# Patient Record
Sex: Female | Born: 1975 | Race: White | Hispanic: No | Marital: Married
Health system: Southern US, Community
[De-identification: ages and names within clinical notes are randomized; demographics above are authoritative.]

## PROBLEM LIST (undated history)

## (undated) HISTORY — PX: BREAST BIOPSY: SHX20

---

## 2013-06-11 ENCOUNTER — Other Ambulatory Visit: Payer: Self-pay

## 2013-06-11 DIAGNOSIS — Z1231 Encounter for screening mammogram for malignant neoplasm of breast: Secondary | ICD-10-CM

## 2013-06-25 ENCOUNTER — Ambulatory Visit
Admission: RE | Admit: 2013-06-25 | Discharge: 2013-06-25 | Disposition: A | Discharge status: Home / Self Care | Payer: 59 | Source: Ambulatory Visit

## 2013-06-25 ENCOUNTER — Encounter (INDEPENDENT_AMBULATORY_CARE_PROVIDER_SITE_OTHER): Payer: Self-pay

## 2013-06-25 DIAGNOSIS — Z1231 Encounter for screening mammogram for malignant neoplasm of breast: Secondary | ICD-10-CM

## 2015-12-05 DIAGNOSIS — Z0000 Encounter for general adult medical examination without abnormal findings: Secondary | ICD-10-CM | POA: Diagnosis not present

## 2015-12-05 DIAGNOSIS — N946 Dysmenorrhea, unspecified: Secondary | ICD-10-CM | POA: Diagnosis not present

## 2015-12-05 DIAGNOSIS — J309 Allergic rhinitis, unspecified: Secondary | ICD-10-CM | POA: Diagnosis not present

## 2015-12-05 DIAGNOSIS — F411 Generalized anxiety disorder: Secondary | ICD-10-CM | POA: Diagnosis not present

## 2015-12-05 MED FILL — SERTRALINE HCL 50 MG TABLET: 50 | 30 days supply | Qty: 30 | Fill #0

## 2015-12-05 MED FILL — LARIN FE 1.5-30 TABLET: 1.5-30 | 84 days supply | Qty: 84 | Fill #0

## 2016-01-03 DIAGNOSIS — E039 Hypothyroidism, unspecified: Secondary | ICD-10-CM | POA: Diagnosis not present

## 2016-01-03 DIAGNOSIS — F411 Generalized anxiety disorder: Secondary | ICD-10-CM | POA: Diagnosis not present

## 2016-01-03 MED FILL — SERTRALINE HCL 100 MG TAB: 100 | 30 days supply | Qty: 30 | Fill #0

## 2016-02-05 MED FILL — SERTRALINE HCL 100 MG TAB: 100 | 30 days supply | Qty: 30 | Fill #1

## 2016-02-06 DIAGNOSIS — E039 Hypothyroidism, unspecified: Secondary | ICD-10-CM | POA: Diagnosis not present

## 2016-03-04 MED FILL — SERTRALINE HCL 100 MG TAB: 100 | 30 days supply | Qty: 30 | Fill #2

## 2016-03-04 MED FILL — LARIN FE 1.5-30 TABLET: 1.5-30 | 84 days supply | Qty: 84 | Fill #1

## 2016-04-03 MED FILL — SERTRALINE HCL 100 MG TAB: 100 | 30 days supply | Qty: 30 | Fill #3

## 2016-05-06 MED FILL — SERTRALINE HCL 100 MG TAB: 100 | 30 days supply | Qty: 30 | Fill #4

## 2016-05-22 MED FILL — LARIN FE 1.5-30 TABLET: 1.5-30 | 84 days supply | Qty: 84 | Fill #2

## 2016-06-04 MED FILL — SERTRALINE HCL 100 MG TAB: 100 | 30 days supply | Qty: 30 | Fill #5

## 2016-07-08 MED FILL — SERTRALINE HCL 100 MG TAB: 100 | 30 days supply | Qty: 30 | Fill #6

## 2016-08-07 MED FILL — SERTRALINE HCL 100 MG TAB: 100 | 30 days supply | Qty: 30 | Fill #7

## 2016-08-07 MED FILL — LARIN FE 1.5-30 TABLET: 1.5-30 | 84 days supply | Qty: 84 | Fill #3

## 2016-09-03 MED FILL — SERTRALINE HCL 100 MG TAB: 100 | 30 days supply | Qty: 30 | Fill #8

## 2016-10-03 MED FILL — SERTRALINE HCL 100 MG TAB: 100 | 30 days supply | Qty: 30 | Fill #9

## 2016-11-04 MED FILL — SERTRALINE HCL 100 MG TAB: 100 | 30 days supply | Qty: 30 | Fill #10

## 2016-11-04 MED FILL — LARIN FE 1.5-30 TABLET: 1.5-30 | 28 days supply | Qty: 28 | Fill #4

## 2016-12-04 MED FILL — SERTRALINE HCL 100 MG TAB: 100 | 30 days supply | Qty: 30 | Fill #11

## 2016-12-04 MED FILL — LARIN FE 1.5-30 TABLET: 1.5-30 | 84 days supply | Qty: 84 | Fill #0

## 2016-12-16 ENCOUNTER — Other Ambulatory Visit: Payer: Self-pay | Admitting: Family Medicine

## 2016-12-16 ENCOUNTER — Other Ambulatory Visit (HOSPITAL_COMMUNITY)
Admission: RE | Admit: 2016-12-16 | Discharge: 2016-12-16 | Disposition: A | Discharge status: Home / Self Care | Payer: 59 | Source: Ambulatory Visit | Attending: Family Medicine | Admitting: Family Medicine

## 2016-12-16 DIAGNOSIS — Z01411 Encounter for gynecological examination (general) (routine) with abnormal findings: Secondary | ICD-10-CM | POA: Diagnosis not present

## 2016-12-16 DIAGNOSIS — Z803 Family history of malignant neoplasm of breast: Secondary | ICD-10-CM | POA: Diagnosis not present

## 2016-12-16 DIAGNOSIS — F411 Generalized anxiety disorder: Secondary | ICD-10-CM | POA: Diagnosis not present

## 2016-12-16 DIAGNOSIS — L209 Atopic dermatitis, unspecified: Secondary | ICD-10-CM | POA: Diagnosis not present

## 2016-12-16 DIAGNOSIS — Z136 Encounter for screening for cardiovascular disorders: Secondary | ICD-10-CM | POA: Diagnosis not present

## 2016-12-16 DIAGNOSIS — J309 Allergic rhinitis, unspecified: Secondary | ICD-10-CM | POA: Diagnosis not present

## 2016-12-16 DIAGNOSIS — E039 Hypothyroidism, unspecified: Secondary | ICD-10-CM | POA: Diagnosis not present

## 2016-12-16 DIAGNOSIS — Z0000 Encounter for general adult medical examination without abnormal findings: Secondary | ICD-10-CM | POA: Diagnosis not present

## 2016-12-16 DIAGNOSIS — Z124 Encounter for screening for malignant neoplasm of cervix: Secondary | ICD-10-CM | POA: Diagnosis not present

## 2016-12-18 LAB — CYTOLOGY - PAP: DIAGNOSIS: NEGATIVE

## 2017-01-06 MED FILL — SERTRALINE HCL 100 MG TAB: 100 | 30 days supply | Qty: 30 | Fill #0

## 2017-02-07 MED FILL — SERTRALINE HCL 100 MG TAB: 100 | 30 days supply | Qty: 30 | Fill #1

## 2017-02-28 MED FILL — LARIN FE 1.5-30 TABLET: 1.5-30 | 84 days supply | Qty: 84 | Fill #1

## 2017-03-10 MED FILL — SERTRALINE HCL 100 MG TAB: 100 | 30 days supply | Qty: 30 | Fill #2

## 2017-04-07 MED FILL — SERTRALINE HCL 100 MG TAB: 100 | 30 days supply | Qty: 30 | Fill #3

## 2017-05-08 MED FILL — SERTRALINE HCL 100 MG TAB: 100 | 30 days supply | Qty: 30 | Fill #4

## 2017-06-03 MED FILL — LARIN FE 1.5-30 TABLET: 1.5-30 | 84 days supply | Qty: 84 | Fill #2

## 2017-06-03 MED FILL — SERTRALINE HCL 100 MG TAB: 100 | 30 days supply | Qty: 30 | Fill #5

## 2017-07-09 MED FILL — SERTRALINE HCL 100 MG TAB: 100 | 30 days supply | Qty: 30 | Fill #6

## 2017-08-07 MED FILL — SERTRALINE HCL 100 MG TAB: 100 | 30 days supply | Qty: 30 | Fill #7

## 2017-09-08 MED FILL — SERTRALINE HCL 100 MG TAB: 100 | 30 days supply | Qty: 30 | Fill #8

## 2017-09-08 MED FILL — LARIN FE 1.5-30 TABLET: 1.5-30 | 84 days supply | Qty: 84 | Fill #3

## 2017-10-06 MED FILL — SERTRALINE HCL 100 MG TAB: 100 | 30 days supply | Qty: 30 | Fill #9

## 2017-11-06 MED FILL — SERTRALINE HCL 100 MG TAB: 100 | 30 days supply | Qty: 30 | Fill #10

## 2017-12-08 MED FILL — SERTRALINE HCL 100 MG TAB: 100 | 30 days supply | Qty: 30 | Fill #11

## 2017-12-08 MED FILL — LARIN FE 1.5-30 TABLET: 1.5-30 | 28 days supply | Qty: 28 | Fill #0

## 2017-12-22 DIAGNOSIS — J309 Allergic rhinitis, unspecified: Secondary | ICD-10-CM | POA: Diagnosis not present

## 2017-12-22 DIAGNOSIS — Z0000 Encounter for general adult medical examination without abnormal findings: Secondary | ICD-10-CM | POA: Diagnosis not present

## 2017-12-22 DIAGNOSIS — Z13220 Encounter for screening for lipoid disorders: Secondary | ICD-10-CM | POA: Diagnosis not present

## 2017-12-22 DIAGNOSIS — Z309 Encounter for contraceptive management, unspecified: Secondary | ICD-10-CM | POA: Diagnosis not present

## 2017-12-22 DIAGNOSIS — F411 Generalized anxiety disorder: Secondary | ICD-10-CM | POA: Diagnosis not present

## 2018-01-07 ENCOUNTER — Other Ambulatory Visit: Payer: Self-pay | Admitting: Family Medicine

## 2018-01-07 DIAGNOSIS — Z1231 Encounter for screening mammogram for malignant neoplasm of breast: Secondary | ICD-10-CM

## 2018-01-23 MED FILL — SERTRALINE HCL 100 MG TAB: 100 | 90 days supply | Qty: 90 | Fill #0

## 2018-01-27 MED FILL — LARIN FE 1.5-30 TABLET: 1.5-30 | 84 days supply | Qty: 84 | Fill #0

## 2018-02-23 ENCOUNTER — Ambulatory Visit
Admission: RE | Admit: 2018-02-23 | Discharge: 2018-02-23 | Disposition: A | Discharge status: Home / Self Care | Payer: 59 | Source: Ambulatory Visit | Attending: Family Medicine | Admitting: Family Medicine

## 2018-02-23 DIAGNOSIS — Z1231 Encounter for screening mammogram for malignant neoplasm of breast: Secondary | ICD-10-CM

## 2018-04-20 MED FILL — SERTRALINE HCL 100 MG TAB: 100 | 90 days supply | Qty: 90 | Fill #1

## 2018-04-20 MED FILL — LARIN FE 1.5-30 TABLET: 1.5-30 | 84 days supply | Qty: 84 | Fill #1

## 2018-04-29 DIAGNOSIS — H524 Presbyopia: Secondary | ICD-10-CM | POA: Diagnosis not present

## 2018-04-29 DIAGNOSIS — H52223 Regular astigmatism, bilateral: Secondary | ICD-10-CM | POA: Diagnosis not present

## 2018-07-20 MED FILL — SERTRALINE HCL 100 MG TAB: 100 | 90 days supply | Qty: 90 | Fill #2

## 2018-07-20 MED FILL — LARIN FE 1.5-30 TABLET: 1.5-30 | 84 days supply | Qty: 84 | Fill #2

## 2018-11-02 MED FILL — SERTRALINE HCL 100 MG TAB: 100 | 90 days supply | Qty: 90 | Fill #3

## 2018-11-03 MED FILL — LARIN FE 1.5-30 TABLET: 1.5-30 | 84 days supply | Qty: 84 | Fill #3

## 2018-12-24 DIAGNOSIS — Z13220 Encounter for screening for lipoid disorders: Secondary | ICD-10-CM | POA: Diagnosis not present

## 2018-12-24 DIAGNOSIS — F411 Generalized anxiety disorder: Secondary | ICD-10-CM | POA: Diagnosis not present

## 2018-12-24 DIAGNOSIS — Z309 Encounter for contraceptive management, unspecified: Secondary | ICD-10-CM | POA: Diagnosis not present

## 2018-12-24 DIAGNOSIS — J309 Allergic rhinitis, unspecified: Secondary | ICD-10-CM | POA: Diagnosis not present

## 2018-12-24 DIAGNOSIS — L209 Atopic dermatitis, unspecified: Secondary | ICD-10-CM | POA: Diagnosis not present

## 2018-12-24 DIAGNOSIS — Z0000 Encounter for general adult medical examination without abnormal findings: Secondary | ICD-10-CM | POA: Diagnosis not present

## 2019-02-01 MED FILL — SERTRALINE HCL 100 MG TAB: 100 | 90 days supply | Qty: 90 | Fill #0

## 2019-02-01 MED FILL — LARIN FE 1.5-30 TABLET: 1.5-30 | 84 days supply | Qty: 84 | Fill #0

## 2019-03-26 ENCOUNTER — Other Ambulatory Visit: Payer: Self-pay | Admitting: Family Medicine

## 2019-03-26 DIAGNOSIS — Z1231 Encounter for screening mammogram for malignant neoplasm of breast: Secondary | ICD-10-CM

## 2019-04-21 ENCOUNTER — Other Ambulatory Visit: Payer: Self-pay

## 2019-04-21 ENCOUNTER — Ambulatory Visit
Admission: RE | Admit: 2019-04-21 | Discharge: 2019-04-21 | Disposition: A | Discharge status: Home / Self Care | Payer: 59 | Source: Ambulatory Visit | Attending: Family Medicine | Admitting: Family Medicine

## 2019-04-21 DIAGNOSIS — Z1231 Encounter for screening mammogram for malignant neoplasm of breast: Secondary | ICD-10-CM

## 2019-05-13 MED FILL — LARIN FE 1.5-30 TABLET: 1.5-30 | 84 days supply | Qty: 84 | Fill #1

## 2019-05-13 MED FILL — SERTRALINE HCL 100 MG TAB: 100 | 90 days supply | Qty: 90 | Fill #1

## 2019-08-06 MED FILL — LARIN FE 1.5-30 TABLET: 1.5-30 | 84 days supply | Qty: 84 | Fill #2

## 2019-08-06 MED FILL — SERTRALINE HCL 100 MG TAB: 100 | 90 days supply | Qty: 90 | Fill #2

## 2019-10-11 DIAGNOSIS — H5211 Myopia, right eye: Secondary | ICD-10-CM | POA: Diagnosis not present

## 2019-10-11 DIAGNOSIS — H52202 Unspecified astigmatism, left eye: Secondary | ICD-10-CM | POA: Diagnosis not present

## 2019-10-11 DIAGNOSIS — H524 Presbyopia: Secondary | ICD-10-CM | POA: Diagnosis not present

## 2019-10-29 MED FILL — SERTRALINE HCL 100 MG TAB: 100 | 90 days supply | Qty: 90 | Fill #3

## 2019-10-29 MED FILL — LARIN FE 1.5-30 TABLET: 1.5-30 | 84 days supply | Qty: 84 | Fill #3

## 2019-12-27 ENCOUNTER — Other Ambulatory Visit (HOSPITAL_COMMUNITY): Payer: Self-pay | Admitting: Family Medicine

## 2019-12-27 DIAGNOSIS — Z13220 Encounter for screening for lipoid disorders: Secondary | ICD-10-CM | POA: Diagnosis not present

## 2019-12-27 DIAGNOSIS — Z309 Encounter for contraceptive management, unspecified: Secondary | ICD-10-CM | POA: Diagnosis not present

## 2019-12-27 DIAGNOSIS — J309 Allergic rhinitis, unspecified: Secondary | ICD-10-CM | POA: Diagnosis not present

## 2019-12-27 DIAGNOSIS — Z803 Family history of malignant neoplasm of breast: Secondary | ICD-10-CM | POA: Diagnosis not present

## 2019-12-27 DIAGNOSIS — Z124 Encounter for screening for malignant neoplasm of cervix: Secondary | ICD-10-CM | POA: Diagnosis not present

## 2019-12-27 DIAGNOSIS — F411 Generalized anxiety disorder: Secondary | ICD-10-CM | POA: Diagnosis not present

## 2019-12-27 DIAGNOSIS — Z0000 Encounter for general adult medical examination without abnormal findings: Secondary | ICD-10-CM | POA: Diagnosis not present

## 2019-12-27 DIAGNOSIS — Z131 Encounter for screening for diabetes mellitus: Secondary | ICD-10-CM | POA: Diagnosis not present

## 2020-01-21 MED FILL — LARIN FE 1.5-30 TABLET: 1.5-30 | 84 days supply | Qty: 84 | Fill #0

## 2020-03-06 IMAGING — MG DIGITAL SCREENING BILATERAL MAMMOGRAM WITH TOMO AND CAD
8 series · 8 of 24 positions shown · non-contrast
Comparison: Previous exam(s).

CLINICAL DATA: Screening.

EXAM:
DIGITAL SCREENING BILATERAL MAMMOGRAM WITH TOMO AND CAD

[L MLO synth-2D]
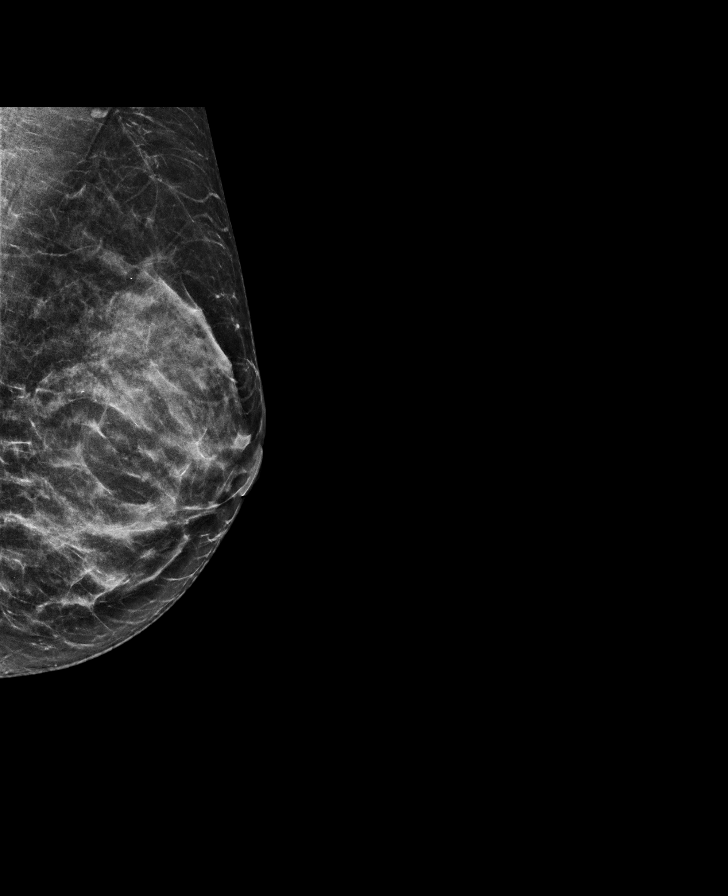

[L CC synth-2D]
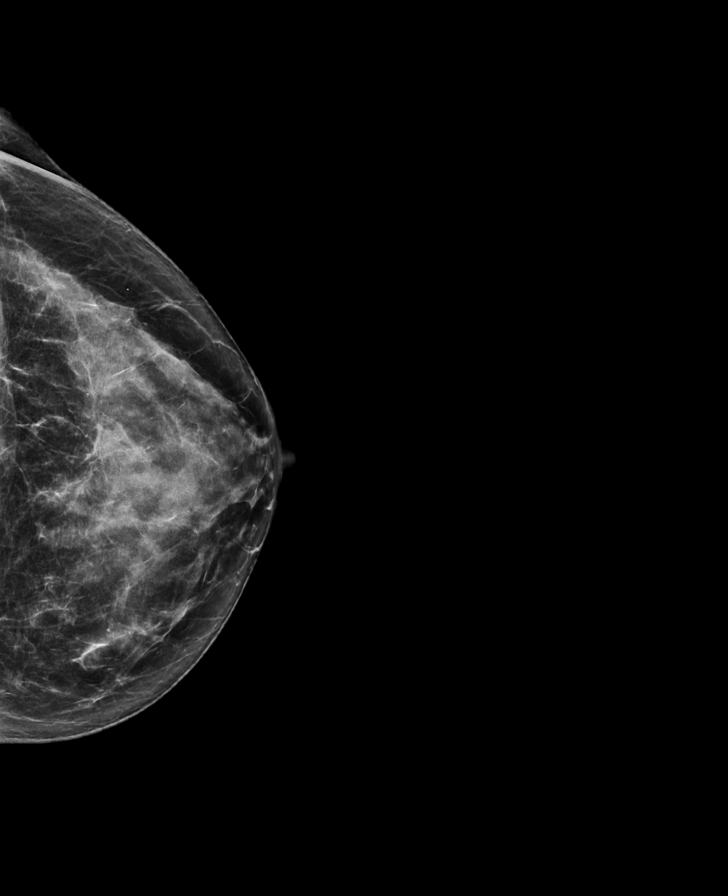

[R MLO synth-2D]
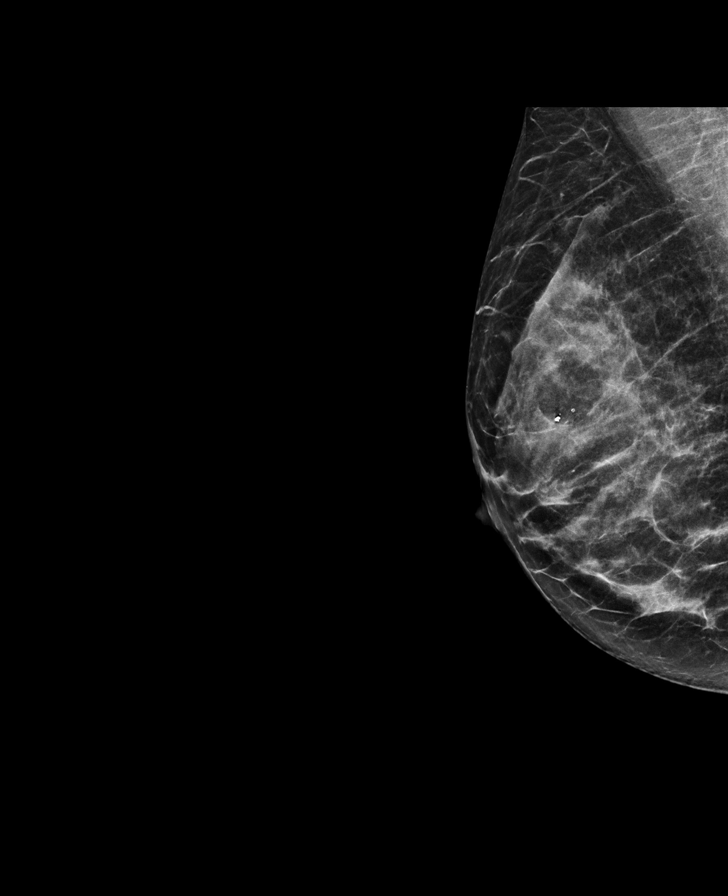

[R CC synth-2D]
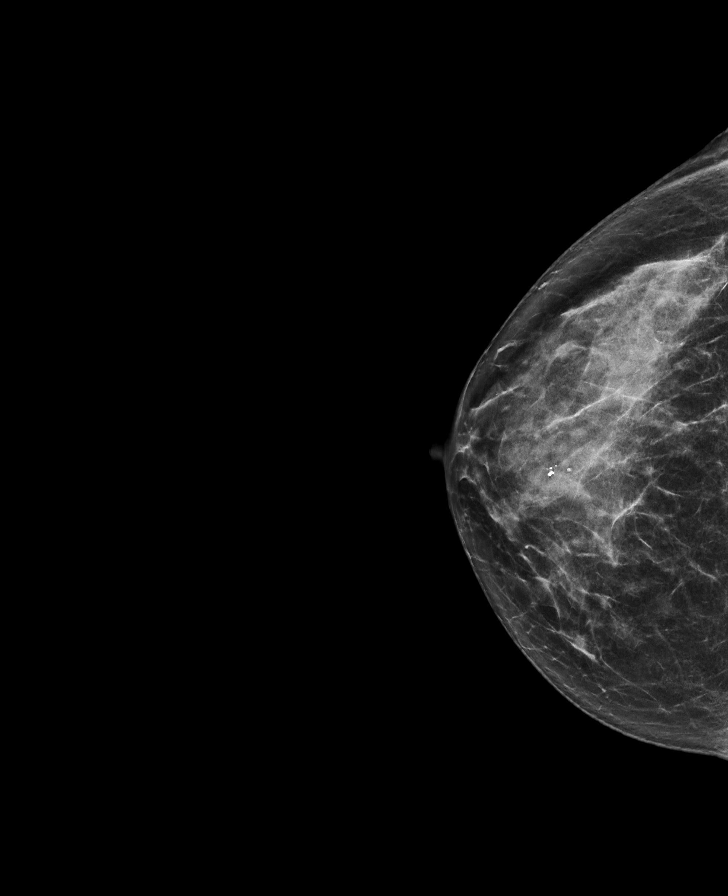

[R CC tomo · tomo slice 35/69.0]
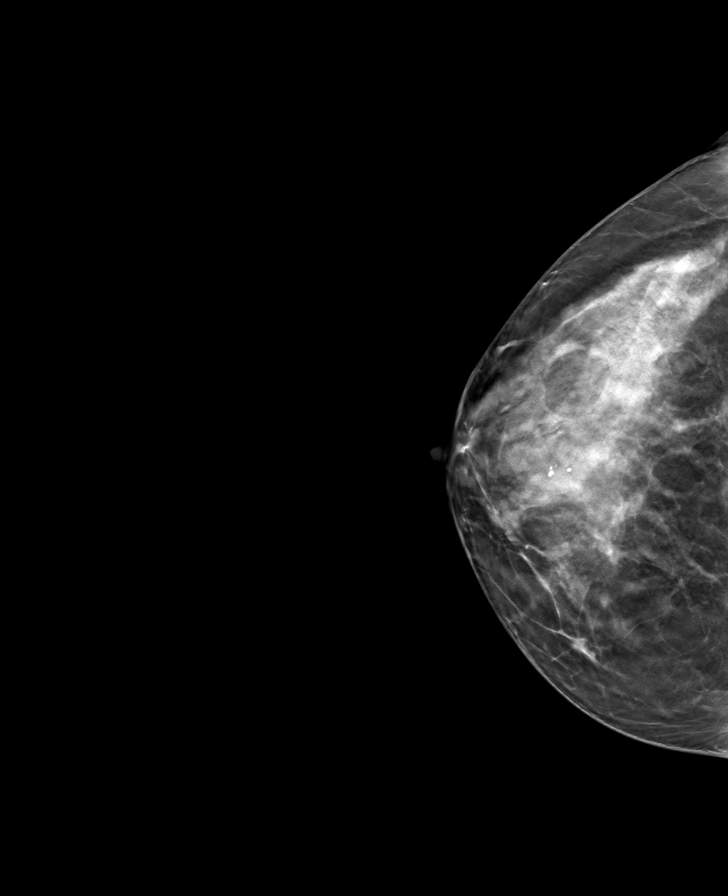

[R MLO tomo · tomo slice 30/59.0]
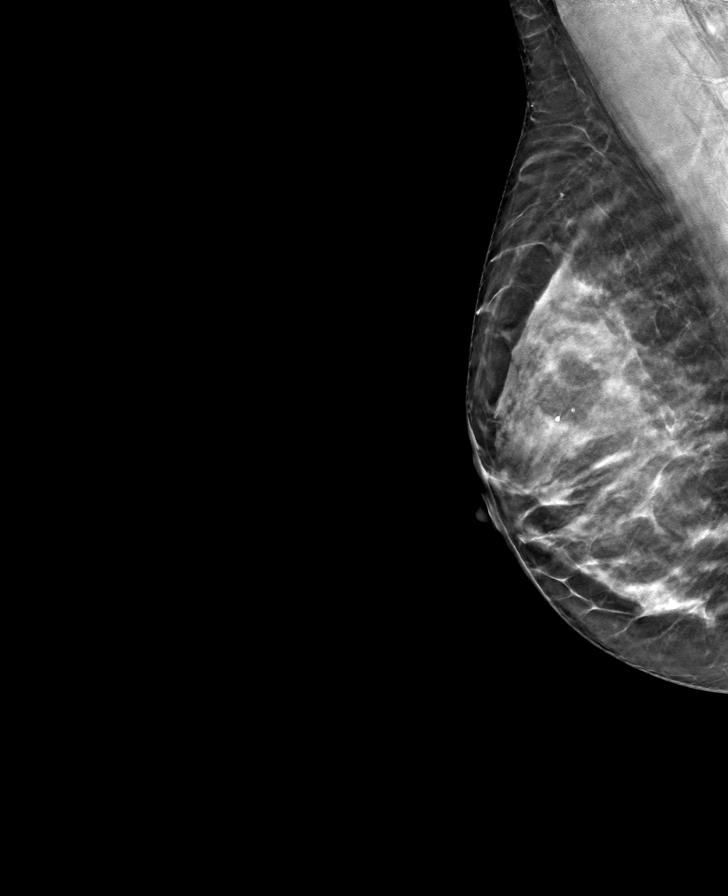

[L CC tomo · tomo slice 34/67.0]
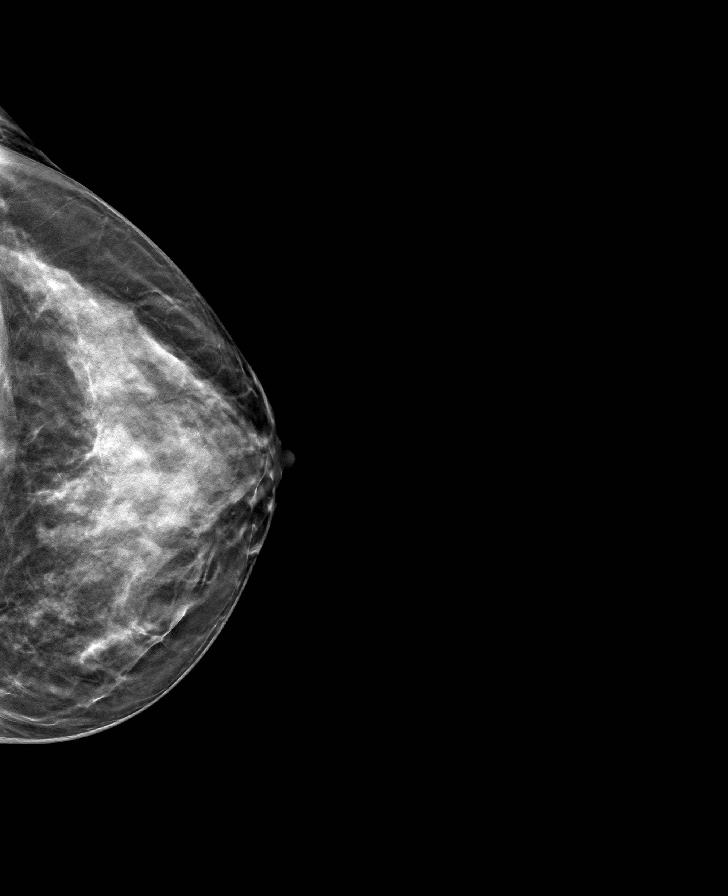

[L MLO tomo · tomo slice 29/56.0]
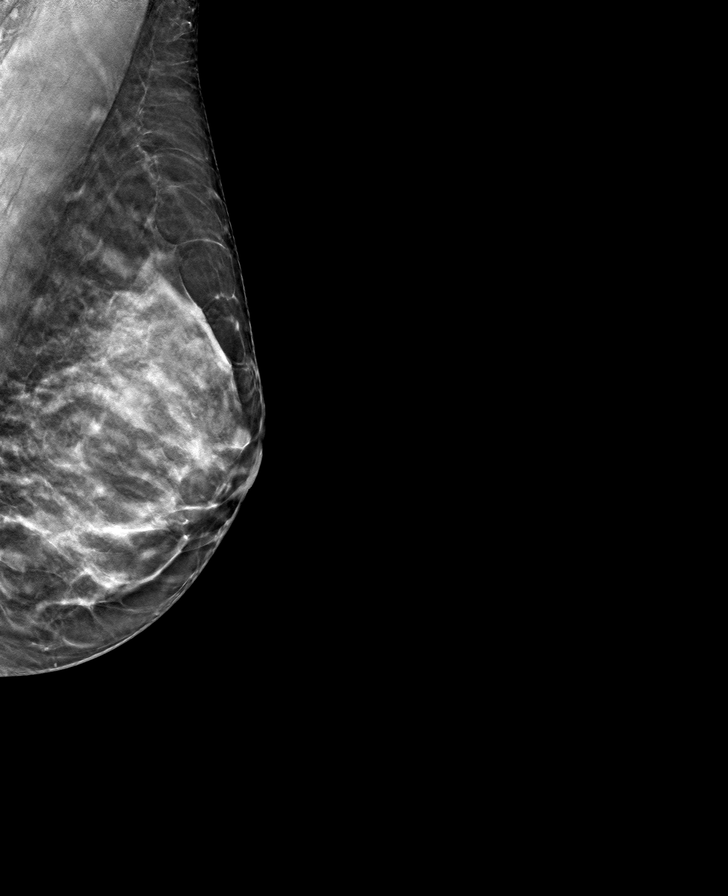

[8 of 24 positions shown; findings below may reference images not displayed]

ACR Breast Density Category c: The breast tissue is heterogeneously
dense, which may obscure small masses.
FINDINGS: There are no findings suspicious for malignancy. Images were
processed with CAD.
IMPRESSION: No mammographic evidence of malignancy. A result letter of this
screening mammogram will be mailed directly to the patient.

RECOMMENDATION:
Screening mammogram in one year. (Code:[id number]-[id number])

BI-RADS CATEGORY  1: Negative.

## 2020-03-13 MED FILL — SERTRALINE HCL 100 MG TAB: 100 | 90 days supply | Qty: 90 | Fill #0

## 2020-04-17 MED FILL — LARIN FE 1.5-30 TABLET: 1.5-30 | 84 days supply | Qty: 84 | Fill #1

## 2020-05-11 ENCOUNTER — Other Ambulatory Visit: Payer: Self-pay | Admitting: Family Medicine

## 2020-05-11 DIAGNOSIS — Z1231 Encounter for screening mammogram for malignant neoplasm of breast: Secondary | ICD-10-CM

## 2020-06-09 ENCOUNTER — Ambulatory Visit
Admission: RE | Admit: 2020-06-09 | Discharge: 2020-06-09 | Disposition: A | Discharge status: Home / Self Care | Payer: 59 | Source: Ambulatory Visit

## 2020-06-09 ENCOUNTER — Other Ambulatory Visit: Payer: Self-pay

## 2020-06-09 DIAGNOSIS — Z1231 Encounter for screening mammogram for malignant neoplasm of breast: Secondary | ICD-10-CM

## 2020-06-12 ENCOUNTER — Other Ambulatory Visit (HOSPITAL_COMMUNITY): Payer: Self-pay

## 2020-06-12 MED FILL — Sertraline HCl Tab 100 MG: ORAL | 90 days supply | Qty: 90 | Fill #0 | Status: AC

## 2020-06-13 ENCOUNTER — Other Ambulatory Visit (HOSPITAL_COMMUNITY): Payer: Self-pay

## 2020-07-07 ENCOUNTER — Other Ambulatory Visit (HOSPITAL_COMMUNITY): Payer: Self-pay

## 2020-07-07 MED FILL — Norethindrone Ace & Ethinyl Estradiol-FE Tab 1.5 MG-30 MCG: ORAL | 84 days supply | Qty: 84 | Fill #0 | Status: AC

## 2020-09-11 ENCOUNTER — Other Ambulatory Visit (HOSPITAL_COMMUNITY): Payer: Self-pay

## 2020-09-11 MED FILL — Sertraline HCl Tab 100 MG: ORAL | 90 days supply | Qty: 90 | Fill #1 | Status: AC

## 2020-10-02 ENCOUNTER — Other Ambulatory Visit (HOSPITAL_COMMUNITY): Payer: Self-pay

## 2020-10-02 MED FILL — Norethindrone Ace & Ethinyl Estradiol-FE Tab 1.5 MG-30 MCG: ORAL | 84 days supply | Qty: 84 | Fill #1 | Status: AC

## 2020-11-17 ENCOUNTER — Other Ambulatory Visit: Payer: Self-pay

## 2020-11-17 ENCOUNTER — Other Ambulatory Visit (HOSPITAL_BASED_OUTPATIENT_CLINIC_OR_DEPARTMENT_OTHER): Payer: Self-pay

## 2020-11-17 ENCOUNTER — Ambulatory Visit: Payer: 59 | Attending: Internal Medicine

## 2020-11-17 DIAGNOSIS — Z23 Encounter for immunization: Secondary | ICD-10-CM

## 2020-11-17 MED ORDER — MODERNA COVID-19 BIVAL BOOSTER 50 MCG/0.5ML IM SUSP
INTRAMUSCULAR | 0 refills | Status: AC
  Filled 2020-11-17: qty 0.5, 1d supply, fill #0

## 2020-11-17 NOTE — Progress Notes (Signed)
   Covid-19 Vaccination [facility]  Name:  MAZEL VILLELA    MRN: [id number] DOB: 03-12-1975  11/17/2020  Ms. Orlowski was observed post Covid-19 immunization for 15 minutes without incident. She was provided with Vaccine Information Sheet and instruction to access the V-Safe system.   Ms. Bonus was instructed to call 911 with any severe reactions post vaccine: Difficulty breathing  Swelling of face and throat  A fast heartbeat  A bad rash all over body  Dizziness and weakness

## 2020-12-15 ENCOUNTER — Other Ambulatory Visit (HOSPITAL_COMMUNITY): Payer: Self-pay

## 2020-12-15 MED ORDER — NORETHIN ACE-ETH ESTRAD-FE 1.5-30 MG-MCG PO TABS
1.0000 | ORAL_TABLET | Freq: Every day | ORAL | 3 refills | Status: DC
Start: 2020-12-15 — End: 2021-11-26
  Filled 2020-12-15: qty 84, 84d supply, fill #0
  Filled 2021-03-19: qty 84, 84d supply, fill #1
  Filled 2021-06-11: qty 84, 84d supply, fill #2
  Filled 2021-08-29: qty 84, 84d supply, fill #3

## 2020-12-15 MED FILL — Sertraline HCl Tab 100 MG: ORAL | 90 days supply | Qty: 90 | Fill #2 | Status: AC

## 2020-12-27 ENCOUNTER — Other Ambulatory Visit (HOSPITAL_COMMUNITY): Payer: Self-pay

## 2020-12-27 DIAGNOSIS — J309 Allergic rhinitis, unspecified: Secondary | ICD-10-CM | POA: Diagnosis not present

## 2020-12-27 DIAGNOSIS — Z0000 Encounter for general adult medical examination without abnormal findings: Secondary | ICD-10-CM | POA: Diagnosis not present

## 2020-12-27 DIAGNOSIS — L209 Atopic dermatitis, unspecified: Secondary | ICD-10-CM | POA: Diagnosis not present

## 2020-12-27 DIAGNOSIS — Z131 Encounter for screening for diabetes mellitus: Secondary | ICD-10-CM | POA: Diagnosis not present

## 2020-12-27 DIAGNOSIS — Z13220 Encounter for screening for lipoid disorders: Secondary | ICD-10-CM | POA: Diagnosis not present

## 2020-12-27 DIAGNOSIS — F411 Generalized anxiety disorder: Secondary | ICD-10-CM | POA: Diagnosis not present

## 2020-12-27 DIAGNOSIS — Z803 Family history of malignant neoplasm of breast: Secondary | ICD-10-CM | POA: Diagnosis not present

## 2020-12-27 MED ORDER — SERTRALINE HCL 100 MG PO TABS
100.0000 mg | ORAL_TABLET | Freq: Every day | ORAL | 3 refills | Status: AC
  Filled 2020-12-27 – 2021-03-19 (×2): qty 90, 90d supply, fill #0
  Filled 2021-06-11: qty 90, 90d supply, fill #1
  Filled 2021-08-29: qty 90, 90d supply, fill #2
  Filled 2021-12-19: qty 90, 90d supply, fill #3

## 2021-03-19 ENCOUNTER — Other Ambulatory Visit (HOSPITAL_COMMUNITY): Payer: Self-pay

## 2021-06-11 ENCOUNTER — Other Ambulatory Visit (HOSPITAL_COMMUNITY): Payer: Self-pay

## 2021-08-29 ENCOUNTER — Other Ambulatory Visit (HOSPITAL_COMMUNITY): Payer: Self-pay

## 2021-11-26 ENCOUNTER — Other Ambulatory Visit (HOSPITAL_COMMUNITY): Payer: Self-pay

## 2021-11-26 MED ORDER — NORETHIN ACE-ETH ESTRAD-FE 1.5-30 MG-MCG PO TABS
1.0000 | ORAL_TABLET | Freq: Every day | ORAL | 1 refills | Status: DC
  Filled 2021-11-26: qty 84, 84d supply, fill #0
  Filled 2022-02-17: qty 84, 84d supply, fill #1

## 2021-12-19 ENCOUNTER — Other Ambulatory Visit (HOSPITAL_COMMUNITY): Payer: Self-pay

## 2021-12-31 DIAGNOSIS — J309 Allergic rhinitis, unspecified: Secondary | ICD-10-CM | POA: Diagnosis not present

## 2021-12-31 DIAGNOSIS — Z1211 Encounter for screening for malignant neoplasm of colon: Secondary | ICD-10-CM | POA: Diagnosis not present

## 2021-12-31 DIAGNOSIS — Z0000 Encounter for general adult medical examination without abnormal findings: Secondary | ICD-10-CM | POA: Diagnosis not present

## 2021-12-31 DIAGNOSIS — E7800 Pure hypercholesterolemia, unspecified: Secondary | ICD-10-CM | POA: Diagnosis not present

## 2021-12-31 DIAGNOSIS — Z131 Encounter for screening for diabetes mellitus: Secondary | ICD-10-CM | POA: Diagnosis not present

## 2021-12-31 DIAGNOSIS — Z309 Encounter for contraceptive management, unspecified: Secondary | ICD-10-CM | POA: Diagnosis not present

## 2021-12-31 DIAGNOSIS — Z803 Family history of malignant neoplasm of breast: Secondary | ICD-10-CM | POA: Diagnosis not present

## 2021-12-31 DIAGNOSIS — F411 Generalized anxiety disorder: Secondary | ICD-10-CM | POA: Diagnosis not present

## 2021-12-31 DIAGNOSIS — L209 Atopic dermatitis, unspecified: Secondary | ICD-10-CM | POA: Diagnosis not present

## 2022-01-01 ENCOUNTER — Other Ambulatory Visit: Payer: Self-pay | Admitting: Internal Medicine

## 2022-01-01 DIAGNOSIS — Z1231 Encounter for screening mammogram for malignant neoplasm of breast: Secondary | ICD-10-CM

## 2022-01-14 ENCOUNTER — Ambulatory Visit
Admission: RE | Admit: 2022-01-14 | Discharge: 2022-01-14 | Disposition: A | Discharge status: Home / Self Care | Payer: 59 | Source: Ambulatory Visit | Attending: Internal Medicine | Admitting: Internal Medicine

## 2022-01-14 DIAGNOSIS — Z1231 Encounter for screening mammogram for malignant neoplasm of breast: Secondary | ICD-10-CM | POA: Diagnosis not present

## 2022-01-14 DIAGNOSIS — Z1211 Encounter for screening for malignant neoplasm of colon: Secondary | ICD-10-CM | POA: Diagnosis not present

## 2022-01-22 ENCOUNTER — Other Ambulatory Visit (HOSPITAL_COMMUNITY): Payer: Self-pay

## 2022-02-18 ENCOUNTER — Other Ambulatory Visit (HOSPITAL_COMMUNITY): Payer: Self-pay

## 2022-02-19 ENCOUNTER — Other Ambulatory Visit (HOSPITAL_COMMUNITY): Payer: Self-pay

## 2022-03-12 ENCOUNTER — Other Ambulatory Visit (HOSPITAL_COMMUNITY): Payer: Self-pay

## 2022-03-12 MED ORDER — SERTRALINE HCL 100 MG PO TABS
100.0000 mg | ORAL_TABLET | Freq: Every day | ORAL | 1 refills | Status: DC
  Filled 2022-03-12: qty 90, 90d supply, fill #0
  Filled 2022-06-12: qty 90, 90d supply, fill #1

## 2022-03-13 ENCOUNTER — Other Ambulatory Visit (HOSPITAL_COMMUNITY): Payer: Self-pay

## 2022-05-12 ENCOUNTER — Other Ambulatory Visit (HOSPITAL_COMMUNITY): Payer: Self-pay

## 2022-05-13 ENCOUNTER — Other Ambulatory Visit (HOSPITAL_COMMUNITY): Payer: Self-pay

## 2022-05-13 MED ORDER — NORETHIN ACE-ETH ESTRAD-FE 1.5-30 MG-MCG PO TABS
1.0000 | ORAL_TABLET | Freq: Every day | ORAL | 2 refills | Status: AC
  Filled 2022-05-13: qty 84, 84d supply, fill #0
  Filled 2022-08-01: qty 84, 84d supply, fill #1
  Filled 2022-10-27: qty 84, 84d supply, fill #2

## 2022-06-12 ENCOUNTER — Other Ambulatory Visit (HOSPITAL_COMMUNITY): Payer: Self-pay

## 2022-06-25 ENCOUNTER — Other Ambulatory Visit (HOSPITAL_COMMUNITY): Payer: Self-pay

## 2022-08-01 ENCOUNTER — Other Ambulatory Visit: Payer: Self-pay

## 2022-09-20 ENCOUNTER — Other Ambulatory Visit (HOSPITAL_COMMUNITY): Payer: Self-pay

## 2022-09-20 MED ORDER — SERTRALINE HCL 100 MG PO TABS
100.0000 mg | ORAL_TABLET | Freq: Every day | ORAL | 3 refills | Status: AC
  Filled 2022-09-20: qty 90, 90d supply, fill #0
  Filled 2022-12-17: qty 90, 90d supply, fill #1
  Filled 2023-06-17: qty 90, 90d supply, fill #2

## 2022-11-16 ENCOUNTER — Other Ambulatory Visit (HOSPITAL_BASED_OUTPATIENT_CLINIC_OR_DEPARTMENT_OTHER): Payer: Self-pay

## 2022-11-16 MED ORDER — INFLUENZA VIRUS VACC SPLIT PF (FLUZONE) 0.5 ML IM SUSY
0.5000 mL | PREFILLED_SYRINGE | Freq: Once | INTRAMUSCULAR | 0 refills | Status: AC
  Filled 2022-11-16: qty 0.5, 1d supply, fill #0

## 2022-12-21 ENCOUNTER — Other Ambulatory Visit: Payer: Self-pay | Admitting: Internal Medicine

## 2022-12-21 DIAGNOSIS — Z1231 Encounter for screening mammogram for malignant neoplasm of breast: Secondary | ICD-10-CM

## 2023-01-07 ENCOUNTER — Other Ambulatory Visit (HOSPITAL_COMMUNITY): Payer: Self-pay

## 2023-01-07 DIAGNOSIS — Z803 Family history of malignant neoplasm of breast: Secondary | ICD-10-CM | POA: Diagnosis not present

## 2023-01-07 DIAGNOSIS — Z309 Encounter for contraceptive management, unspecified: Secondary | ICD-10-CM | POA: Diagnosis not present

## 2023-01-07 DIAGNOSIS — F411 Generalized anxiety disorder: Secondary | ICD-10-CM | POA: Diagnosis not present

## 2023-01-07 DIAGNOSIS — J309 Allergic rhinitis, unspecified: Secondary | ICD-10-CM | POA: Diagnosis not present

## 2023-01-07 DIAGNOSIS — L209 Atopic dermatitis, unspecified: Secondary | ICD-10-CM | POA: Diagnosis not present

## 2023-01-07 DIAGNOSIS — Z23 Encounter for immunization: Secondary | ICD-10-CM | POA: Diagnosis not present

## 2023-01-07 DIAGNOSIS — E7800 Pure hypercholesterolemia, unspecified: Secondary | ICD-10-CM | POA: Diagnosis not present

## 2023-01-07 DIAGNOSIS — Z0000 Encounter for general adult medical examination without abnormal findings: Secondary | ICD-10-CM | POA: Diagnosis not present

## 2023-01-07 MED ORDER — NORETHIN ACE-ETH ESTRAD-FE 1.5-30 MG-MCG PO TABS
1.0000 | ORAL_TABLET | Freq: Every day | ORAL | 2 refills | Status: DC
  Filled 2023-01-07: qty 84, 84d supply, fill #0
  Filled 2023-06-17: qty 84, 84d supply, fill #1
  Filled 2023-09-15 – 2023-09-29 (×2): qty 84, 84d supply, fill #2

## 2023-01-07 MED ORDER — SERTRALINE HCL 100 MG PO TABS
100.0000 mg | ORAL_TABLET | Freq: Every day | ORAL | 3 refills | Status: AC
  Filled 2023-03-17: qty 90, 90d supply, fill #0
  Filled 2023-09-15 – 2023-09-29 (×2): qty 90, 90d supply, fill #1
  Filled 2023-12-23: qty 90, 90d supply, fill #2

## 2023-01-15 ENCOUNTER — Other Ambulatory Visit (HOSPITAL_COMMUNITY): Payer: Self-pay

## 2023-01-20 ENCOUNTER — Ambulatory Visit
Admission: RE | Admit: 2023-01-20 | Discharge: 2023-01-20 | Disposition: A | Discharge status: Home / Self Care | Payer: 59 | Source: Ambulatory Visit

## 2023-01-20 DIAGNOSIS — Z1231 Encounter for screening mammogram for malignant neoplasm of breast: Secondary | ICD-10-CM

## 2023-03-17 ENCOUNTER — Other Ambulatory Visit (HOSPITAL_COMMUNITY): Payer: Self-pay

## 2023-06-18 ENCOUNTER — Other Ambulatory Visit: Payer: Self-pay

## 2023-06-18 ENCOUNTER — Other Ambulatory Visit (HOSPITAL_COMMUNITY): Payer: Self-pay

## 2023-09-24 ENCOUNTER — Other Ambulatory Visit (HOSPITAL_COMMUNITY): Payer: Self-pay

## 2023-09-29 ENCOUNTER — Other Ambulatory Visit (HOSPITAL_COMMUNITY): Payer: Self-pay

## 2023-10-17 ENCOUNTER — Ambulatory Visit (INDEPENDENT_AMBULATORY_CARE_PROVIDER_SITE_OTHER): Admitting: Podiatry

## 2023-10-17 ENCOUNTER — Encounter: Payer: Self-pay | Admitting: Podiatry

## 2023-10-17 ENCOUNTER — Ambulatory Visit (INDEPENDENT_AMBULATORY_CARE_PROVIDER_SITE_OTHER)

## 2023-10-17 DIAGNOSIS — M21612 Bunion of left foot: Secondary | ICD-10-CM

## 2023-10-17 DIAGNOSIS — M21611 Bunion of right foot: Secondary | ICD-10-CM

## 2023-10-17 DIAGNOSIS — M21619 Bunion of unspecified foot: Secondary | ICD-10-CM

## 2023-10-17 NOTE — Progress Notes (Signed)
       Chief Complaint  Patient presents with   Bunions    RM 7       HPI: 48 y.o. female presents today for bunion evaluation.  They have been bothering her for several months now ever since she has been back in the office, was previously working remote.  Certain shoes i.e. women's dress shoes which she needs to wear for work seem to aggravate her with pain over the bump.  Primarily wears flats.  She is most interested in conservative treatment at this time.  History reviewed. No pertinent past medical history.  Past Surgical History:  Procedure Laterality Date   BREAST BIOPSY      No Known Allergies  ROS    PHYSICAL EXAM:  General: The patient is alert and oriented x3 in no acute distress.  Dermatology: Skin is warm, dry and supple bilateral lower extremities. Interspaces are clear of maceration and debris.  No rashes noted.   Vascular: Palpable pedal pulses bilaterally. Capillary refill within normal limits.  No appreciable edema.  No erythema or calor.  Neurological: Light touch sensation grossly intact bilateral feet.   Musculoskeletal Exam:  There is a bony medial prominence on the dorsomedial aspect of the 1st metatarsal head of the bilateral foot.  There is pain on palpation of the bump in this area.  Lateral deviation of the hallux at the MPJ level.  Deformity is reducible.  1st MPJ ROM is adequate, does decrease somewhat with loading of the forefoot.  Mild crepitus with end range of motion plantarflexion.  Hallux is tracking, not trackbound.    RADIOGRAPHIC EXAM: Left and right foot radiographs 3 views weightbearing 10/17/2023 Increased first intermetatarsal angle approximately 11-12 degrees bilaterally.  Mild increased hallux abductus angle.  Enlargement of bone at dorsomedial 1st metatarsal head.  Joint space is preserved.  Dorsal prominence of first met head noted bilaterally as well.  ASSESSMENT/PLAN OF CARE: 1. Bilateral bunions      No orders of the defined  types were placed in this encounter.  FOR HOME USE ONLY DME CUSTOM ORTHOTICS  Discussed patient's condition and possible etiologies today.  Radiographs reviewed with patient in detail.  Discussed conservative treatment options with patient today, including shoe modification / arch supports, off-loading, cortisone injectione, NSAID topical / oral therapy, and toe splints and shields.  Briefly discussed surgical intervention if conservative options are not successful.    Today did dispense bunion shields x 2 to the patient.  She is also interested in custom orthotics.  Believe that she would benefit from this to limit the progression of the bunions.  Think this will provide her some benefit as she needs to wear dress shoes for work.  Prescription written for patient.   Return in about 2 months (around 12/17/2023), or if symptoms worsen or fail to improve, for bunions.    Mary Hammond, AACFAS [facility]     [address] [place], [place] [address]                Office [phone]  Fax [phone]

## 2023-10-23 DIAGNOSIS — H5211 Myopia, right eye: Secondary | ICD-10-CM | POA: Diagnosis not present

## 2023-10-23 DIAGNOSIS — H524 Presbyopia: Secondary | ICD-10-CM | POA: Diagnosis not present

## 2023-10-23 DIAGNOSIS — H52222 Regular astigmatism, left eye: Secondary | ICD-10-CM | POA: Diagnosis not present

## 2023-11-13 ENCOUNTER — Other Ambulatory Visit

## 2023-12-08 ENCOUNTER — Other Ambulatory Visit: Payer: Self-pay | Admitting: Internal Medicine

## 2023-12-08 DIAGNOSIS — Z1231 Encounter for screening mammogram for malignant neoplasm of breast: Secondary | ICD-10-CM

## 2023-12-18 ENCOUNTER — Ambulatory Visit (INDEPENDENT_AMBULATORY_CARE_PROVIDER_SITE_OTHER): Admitting: Podiatry

## 2023-12-18 DIAGNOSIS — M21611 Bunion of right foot: Secondary | ICD-10-CM

## 2023-12-18 DIAGNOSIS — M21612 Bunion of left foot: Secondary | ICD-10-CM

## 2023-12-18 NOTE — Progress Notes (Signed)
 Patient presents today to be casted for custom molded orthotics. 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   Impression foam cast was taken. ABN signed.  Patient info-  Shoe size: 7 W  Shoe style: Dress Shoe  Height: 5'3  Weight: 130  Insurance: [employer] - [employer]   Patient will be notified once orthotics arrive in office and reappoint for fitting at that time.

## 2023-12-19 DIAGNOSIS — Z01818 Encounter for other preprocedural examination: Secondary | ICD-10-CM | POA: Diagnosis not present

## 2023-12-23 ENCOUNTER — Other Ambulatory Visit (HOSPITAL_COMMUNITY): Payer: Self-pay

## 2023-12-23 ENCOUNTER — Other Ambulatory Visit: Payer: Self-pay

## 2023-12-23 MED ORDER — NORETHIN ACE-ETH ESTRAD-FE 1.5-30 MG-MCG PO TABS
1.0000 | ORAL_TABLET | Freq: Every day | ORAL | 2 refills | Status: AC
  Filled 2023-12-23: qty 84, 84d supply, fill #0

## 2023-12-29 ENCOUNTER — Other Ambulatory Visit (HOSPITAL_COMMUNITY): Payer: Self-pay

## 2023-12-29 MED ORDER — ONDANSETRON HCL 4 MG PO TABS
4.0000 mg | ORAL_TABLET | Freq: Three times a day (TID) | ORAL | 0 refills | Status: AC
  Filled 2023-12-29: qty 8, 3d supply, fill #0

## 2023-12-29 MED ORDER — OXYCODONE HCL 5 MG PO TABS
5.0000 mg | ORAL_TABLET | ORAL | 0 refills | Status: AC
  Filled 2023-12-29: qty 12, 2d supply, fill #0

## 2023-12-29 MED ORDER — ACETAMINOPHEN 325 MG PO TABS
350.0000 mg | ORAL_TABLET | Freq: Four times a day (QID) | ORAL | 0 refills | Status: AC
  Filled 2023-12-29: qty 40, 10d supply, fill #0

## 2023-12-29 MED ORDER — DOCUSATE CALCIUM 240 MG PO CAPS
240.0000 mg | ORAL_CAPSULE | ORAL | 0 refills | Status: AC
  Filled 2023-12-29: qty 6, 3d supply, fill #0

## 2023-12-29 MED ORDER — METHYLPREDNISOLONE 4 MG PO TBPK
ORAL_TABLET | ORAL | 0 refills | Status: AC
  Filled 2023-12-29: qty 21, 6d supply, fill #0

## 2023-12-30 ENCOUNTER — Other Ambulatory Visit (HOSPITAL_COMMUNITY): Payer: Self-pay

## 2023-12-31 ENCOUNTER — Other Ambulatory Visit (HOSPITAL_COMMUNITY): Payer: Self-pay

## 2024-01-01 DIAGNOSIS — H16223 Keratoconjunctivitis sicca, not specified as Sjogren's, bilateral: Secondary | ICD-10-CM | POA: Diagnosis not present

## 2024-01-19 ENCOUNTER — Ambulatory Visit

## 2024-01-19 DIAGNOSIS — M21612 Bunion of left foot: Secondary | ICD-10-CM

## 2024-01-19 DIAGNOSIS — M21611 Bunion of right foot: Secondary | ICD-10-CM

## 2024-01-20 ENCOUNTER — Other Ambulatory Visit (HOSPITAL_COMMUNITY): Payer: Self-pay

## 2024-01-20 DIAGNOSIS — Z124 Encounter for screening for malignant neoplasm of cervix: Secondary | ICD-10-CM | POA: Diagnosis not present

## 2024-01-20 DIAGNOSIS — L209 Atopic dermatitis, unspecified: Secondary | ICD-10-CM | POA: Diagnosis not present

## 2024-01-20 DIAGNOSIS — F411 Generalized anxiety disorder: Secondary | ICD-10-CM | POA: Diagnosis not present

## 2024-01-20 DIAGNOSIS — R7303 Prediabetes: Secondary | ICD-10-CM | POA: Diagnosis not present

## 2024-01-20 DIAGNOSIS — Z309 Encounter for contraceptive management, unspecified: Secondary | ICD-10-CM | POA: Diagnosis not present

## 2024-01-20 DIAGNOSIS — E7800 Pure hypercholesterolemia, unspecified: Secondary | ICD-10-CM | POA: Diagnosis not present

## 2024-01-20 DIAGNOSIS — J309 Allergic rhinitis, unspecified: Secondary | ICD-10-CM | POA: Diagnosis not present

## 2024-01-20 DIAGNOSIS — Z0000 Encounter for general adult medical examination without abnormal findings: Secondary | ICD-10-CM | POA: Diagnosis not present

## 2024-01-20 DIAGNOSIS — Z803 Family history of malignant neoplasm of breast: Secondary | ICD-10-CM | POA: Diagnosis not present

## 2024-01-20 MED ORDER — BUSPIRONE HCL 7.5 MG PO TABS
7.5000 mg | ORAL_TABLET | Freq: Two times a day (BID) | ORAL | 3 refills | Status: AC
  Filled 2024-01-20: qty 60, 30d supply, fill #0
  Filled 2024-03-09: qty 60, 30d supply, fill #1

## 2024-01-20 MED ORDER — XDEMVY 0.25 % OP SOLN
1.0000 [drp] | Freq: Two times a day (BID) | OPHTHALMIC | 0 refills | Status: AC
  Filled 2024-01-21: qty 10, 30d supply, fill #0
  Filled ????-??-??: fill #0

## 2024-01-21 ENCOUNTER — Ambulatory Visit
Admission: RE | Admit: 2024-01-21 | Discharge: 2024-01-21 | Disposition: A | Source: Ambulatory Visit | Attending: Internal Medicine

## 2024-01-21 ENCOUNTER — Other Ambulatory Visit: Payer: Self-pay

## 2024-01-21 ENCOUNTER — Other Ambulatory Visit (HOSPITAL_COMMUNITY): Payer: Self-pay

## 2024-01-21 DIAGNOSIS — Z1231 Encounter for screening mammogram for malignant neoplasm of breast: Secondary | ICD-10-CM | POA: Diagnosis not present

## 2024-01-21 NOTE — Progress Notes (Addendum)
 Patient presents today to pick up orthotics. Will submit order as not ready for pick up today

## 2024-01-22 ENCOUNTER — Other Ambulatory Visit (HOSPITAL_COMMUNITY): Payer: Self-pay

## 2024-01-22 ENCOUNTER — Encounter (HOSPITAL_COMMUNITY): Payer: Self-pay

## 2024-01-22 MED ORDER — ROSUVASTATIN CALCIUM 10 MG PO TABS
10.0000 mg | ORAL_TABLET | Freq: Every day | ORAL | 0 refills | Status: AC
  Filled 2024-01-22: qty 90, 90d supply, fill #0

## 2024-01-23 ENCOUNTER — Other Ambulatory Visit (HOSPITAL_COMMUNITY): Payer: Self-pay

## 2024-02-23 ENCOUNTER — Telehealth: Payer: Self-pay | Admitting: Podiatrist

## 2024-02-23 NOTE — Telephone Encounter (Signed)
 Patient stated she never received orthotics and has been billed, when patient came to pick up orthotics on 01/19/24, she was told orthotics has not come in yet and never received call back

## 2024-03-05 ENCOUNTER — Telehealth: Payer: Self-pay | Admitting: Podiatry

## 2024-03-05 NOTE — Telephone Encounter (Signed)
 Dialed [phone]; unable to LV a message- voicemail not set up *Orthotics are in the [place] office
